# Patient Record
Sex: Male | Born: 1970 | Race: Black or African American | Hispanic: No | Marital: Married | Smoking: Never smoker
Health system: Southern US, Community
[De-identification: ages and names within clinical notes are randomized; demographics above are authoritative.]

## PROBLEM LIST (undated history)

## (undated) DIAGNOSIS — F419 Anxiety disorder, unspecified: Secondary | ICD-10-CM

## (undated) DIAGNOSIS — F4310 Post-traumatic stress disorder, unspecified: Secondary | ICD-10-CM

## (undated) DIAGNOSIS — F32A Depression, unspecified: Secondary | ICD-10-CM

## (undated) DIAGNOSIS — F329 Major depressive disorder, single episode, unspecified: Secondary | ICD-10-CM

## (undated) HISTORY — DX: Depression, unspecified: F32.A

## (undated) HISTORY — DX: Anxiety disorder, unspecified: F41.9

## (undated) HISTORY — DX: Major depressive disorder, single episode, unspecified: F32.9

## (undated) HISTORY — DX: Post-traumatic stress disorder, unspecified: F43.10

---

## 2007-09-05 HISTORY — PX: REFRACTIVE SURGERY: SHX103

## 2015-04-17 ENCOUNTER — Ambulatory Visit (INDEPENDENT_AMBULATORY_CARE_PROVIDER_SITE_OTHER): Payer: TRICARE For Life (TFL)

## 2015-04-17 ENCOUNTER — Ambulatory Visit (INDEPENDENT_AMBULATORY_CARE_PROVIDER_SITE_OTHER): Payer: TRICARE For Life (TFL) | Admitting: Family Medicine

## 2015-04-17 VITALS — BP 136/80 | HR 73 | Temp 98.1°F | Ht 77.75 in | Wt 239.0 lb

## 2015-04-17 DIAGNOSIS — M545 Low back pain, unspecified: Secondary | ICD-10-CM

## 2015-04-17 DIAGNOSIS — M79604 Pain in right leg: Secondary | ICD-10-CM

## 2015-04-17 MED ORDER — METHOCARBAMOL 500 MG PO TABS: 500.0000 mg | ORAL_TABLET | Freq: Every evening | ORAL | Status: AC | PRN

## 2015-04-17 MED ORDER — METHYLPREDNISOLONE ACETATE 80 MG/ML IJ SUSP
80.0000 mg | Freq: Once | INTRAMUSCULAR | Status: AC
  Administered 2015-04-17: 80 mg via INTRAMUSCULAR

## 2015-04-17 MED ORDER — CELECOXIB 200 MG PO CAPS: 200.0000 mg | ORAL_CAPSULE | Freq: Two times a day (BID) | ORAL | Status: AC

## 2015-04-17 NOTE — Progress Notes (Signed)
Subjective:    Patient ID: Brandon Paul, male    DOB: 1971/03/09, 44 y.o.   MRN: [id number]  04/17/2015  Tailbone Pain   HPI This 44 y.o. male presents for evaluation of tailbone pain.  Retired [employer]or; diagnosed with sacroiliitis; s/p injections.  Last injection May , 2016 at [facility] in [place].  Just moved from [place] last month.  Was in active duty. Unable to lie on R side at night.  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  Likely back pain is discogenic.  S/p epidurla steroid injection at time; did provide relief with last injection.  Dr. Knox Royalty is out of town; he referred pt to [facility] this weekend; this is brother.  Ongoing for six years.  In last twelve months, pain has worsened.  In past six months, acutely worse.  Pain that experiencing now is the worst ever had.  Lower back pain radiating into R thigh. +n/t/burning in R thigh for past month; constant tingling.  Takes Naprosen  every morning.  No weakness.  Sitting makes pain worse.  +saddle paresthesias; started 3 weeks ago; intermittent.  Tingling in suprapubic region.  Normal b/b dysfunction.   Plantar fasciitis, ED, chronic prostatitis, PTSD, disc degeneration L5-S1.  Sacroiliitis.   Previous use of Celebrex.  Tramadol in past but really strong; could not tolerate well.     Review of Systems  Constitutional: Negative for fever, chills, diaphoresis, activity change, appetite change and fatigue.  Respiratory: Negative for cough and shortness of breath.   Cardiovascular: Negative for chest pain, palpitations and leg swelling.  Gastrointestinal: Negative for nausea, vomiting, abdominal pain and diarrhea.  Endocrine: Negative for cold intolerance, heat intolerance, polydipsia, polyphagia and polyuria.  Genitourinary: Negative for decreased urine volume and difficulty urinating.  Musculoskeletal: Positive for myalgias and back pain.  Skin: Negative for color change, rash and wound.    Neurological: Positive for numbness. Negative for dizziness, tremors, seizures, syncope, facial asymmetry, speech difficulty, weakness, light-headedness and headaches.  Psychiatric/Behavioral: Negative for sleep disturbance and dysphoric mood. The patient is not nervous/anxious.     Past Medical History  Diagnosis Date  . Depression   . Anxiety   . PTSD (post-traumatic stress disorder)    Past Surgical History  Procedure Laterality Date  . Refractive surgery  2009   No Known Allergies Social History   Social History  . Marital Status: Married    Spouse Name: N/A  . Number of Children: N/A  . Years of Education: N/A   Occupational History  . Not on file.   Social History Main Topics  . Smoking status: Never Smoker   . Smokeless tobacco: Never Used  . Alcohol Use: 0.0 oz/week    0 Standard drinks or equivalent per week  . Drug Use: Not on file  . Sexual Activity: Not on file   Other Topics Concern  . Not on file   Social History Narrative  . No narrative on file   History reviewed. No pertinent family history.      Objective:    BP 136/80 mmHg  Pulse 73  Temp(Src) 98.1 F (36.7 C) (Oral)  Ht 6' 5.75" (1.975 m)  Wt 239 lb (108.41 kg)  BMI 27.79 kg/m2  SpO2 97% Physical Exam  Constitutional: He is oriented to person, place, and time. He appears well-developed and well-nourished. No distress.  HENT:  Head: Normocephalic and atraumatic.  Eyes: Conjunctivae and EOM are normal. Pupils are equal, round, and reactive to  light.  Neck: Normal range of motion. Neck supple. Carotid bruit is not present. No thyromegaly present.  Cardiovascular: Normal rate, regular rhythm, normal heart sounds and intact distal pulses.  Exam reveals no gallop and no friction rub.   No murmur heard. Pulmonary/Chest: Effort normal and breath sounds normal. He has no wheezes. He has no rales.  Musculoskeletal:       Lumbar back: He exhibits tenderness, bony tenderness and pain. He  exhibits normal range of motion, no swelling, no edema, no spasm and normal pulse.  Lumbar spine:  Non-tender midline; +tender paraspinal regions R near SI joint.  Straight leg raises negative B; toe and heel walking intact; marching intact; motor 5/5 BLE.  Full ROM lumbar spine without limitation.   Lymphadenopathy:    He has no cervical adenopathy.  Neurological: He is alert and oriented to person, place, and time. No cranial nerve deficit.  Skin: Skin is warm and dry. No rash noted. He is not diaphoretic.  Psychiatric: He has a normal mood and affect. His behavior is normal.  Nursing note and vitals reviewed.  No results found for this or any previous visit.     [facility] reading (PRIMARY) by  Dr. Katrinka Blazing.  LUMBAR SPINE FILMS: NAD.   DEPOMEDROL 80MG  IM ADMINISTERED BY DARRON, CMA. Assessment & Plan:   1. Lumbar pain with radiation down right leg    -Chronic with recent worsening. -New onset radicular symptoms. -s/p Depomedrol in office. -Rx for Celebrex provided; rx for Robaxin provided to take at night. -Stop Naproxen. -Home exercise program provided. -Refer to ortho.    Meds ordered this encounter  Medications  . naproxen (NAPROSYN) 500 MG tablet    Sig: Take 500 mg by mouth 2 (two) times daily with a meal.  . Zolpidem Tartrate (AMBIEN PO)    Sig: Take by mouth.  . Sildenafil Citrate (VIAGRA PO)    Sig: Take by mouth.  . Cetirizine HCl (ZYRTEC ALLERGY PO)    Sig: Take by mouth.  . methylPREDNISolone acetate (DEPO-MEDROL) injection 80 mg    Sig:   . methocarbamol (ROBAXIN) 500 MG tablet    Sig: Take 1-2 tablets (500-1,000 mg total) by mouth at bedtime as needed for muscle spasms.    Dispense:  60 tablet    Refill:  2    No Follow-up on file.   Willett Lefeber Paulita Fujita, M.D. [facility] [address] [place], [place]  [address] [phone] phone [phone] fax

## 2015-04-17 NOTE — Patient Instructions (Signed)
Sciatica with Rehab The sciatic nerve runs from the back down the leg and is responsible for sensation and control of the muscles in the back (posterior) side of the thigh, lower leg, and foot. Sciatica is a condition that is characterized by inflammation of this nerve.  SYMPTOMS   Signs of nerve damage, including numbness and/or weakness along the posterior side of the lower extremity.  Pain in the back of the thigh that may also travel down the leg.  Pain that worsens when sitting for long periods of time.  Occasionally, pain in the back or buttock. CAUSES  Inflammation of the sciatic nerve is the cause of sciatica. The inflammation is due to something irritating the nerve. Common sources of irritation include:  Sitting for long periods of time.  Direct trauma to the nerve.  Arthritis of the spine.  Herniated or ruptured disk.  Slipping of the vertebrae (spondylolisthesis).  Pressure from soft tissues, such as muscles or ligament-like tissue (fascia). RISK INCREASES WITH:  Sports that place pressure or stress on the spine (football or weightlifting).  Poor strength and flexibility.  Failure to warm up properly before activity.  Family history of low back pain or disk disorders.  Previous back injury or surgery.  Poor body mechanics, especially when lifting, or poor posture. PREVENTION   Warm up and stretch properly before activity.  Maintain physical fitness:  Strength, flexibility, and endurance.  Cardiovascular fitness.  Learn and use proper technique, especially with posture and lifting. When possible, have coach correct improper technique.  Avoid activities that place stress on the spine. PROGNOSIS If treated properly, then sciatica usually resolves within 6 weeks. However, occasionally surgery is necessary.  RELATED COMPLICATIONS   Permanent nerve damage, including pain, numbness, tingle, or weakness.  Chronic back pain.  Risks of surgery: infection,  bleeding, nerve damage, or damage to surrounding tissues. TREATMENT Treatment initially involves resting from any activities that aggravate your symptoms. The use of ice and medication may help reduce pain and inflammation. The use of strengthening and stretching exercises may help reduce pain with activity. These exercises may be performed at home or with referral to a therapist. A therapist may recommend further treatments, such as transcutaneous electronic nerve stimulation (TENS) or ultrasound. Your caregiver may recommend corticosteroid injections to help reduce inflammation of the sciatic nerve. If symptoms persist despite non-surgical (conservative) treatment, then surgery may be recommended. MEDICATION  If pain medication is necessary, then nonsteroidal anti-inflammatory medications, such as aspirin and ibuprofen, or other minor pain relievers, such as acetaminophen, are often recommended.  Do not take pain medication for 7 days before surgery.  Prescription pain relievers may be given if deemed necessary by your caregiver. Use only as directed and only as much as you need.  Ointments applied to the skin may be helpful.  Corticosteroid injections may be given by your caregiver. These injections should be reserved for the most serious cases, because they may only be given a certain number of times. HEAT AND COLD  Cold treatment (icing) relieves pain and reduces inflammation. Cold treatment should be applied for 10 to 15 minutes every 2 to 3 hours for inflammation and pain and immediately after any activity that aggravates your symptoms. Use ice packs or massage the area with a piece of ice (ice massage).  Heat treatment may be used prior to performing the stretching and strengthening activities prescribed by your caregiver, physical therapist, or athletic trainer. Use a heat pack or soak the injury in warm water.   SEEK MEDICAL CARE IF:  Treatment seems to offer no benefit, or the condition  worsens.  Any medications produce adverse side effects. EXERCISES  RANGE OF MOTION (ROM) AND STRETCHING EXERCISES - Sciatica Most people with sciatic will find that their symptoms worsen with either excessive bending forward (flexion) or arching at the low back (extension). The exercises which will help resolve your symptoms will focus on the opposite motion. Your physician, physical therapist or athletic trainer will help you determine which exercises will be most helpful to resolve your low back pain. Do not complete any exercises without first consulting with your clinician. Discontinue any exercises which worsen your symptoms until you speak to your clinician. If you have pain, numbness or tingling which travels down into your buttocks, leg or foot, the goal of the therapy is for these symptoms to move closer to your back and eventually resolve. Occasionally, these leg symptoms will get better, but your low back pain may worsen; this is typically an indication of progress in your rehabilitation. Be certain to be very alert to any changes in your symptoms and the activities in which you participated in the 24 hours prior to the change. Sharing this information with your clinician will allow him/her to most efficiently treat your condition. These exercises may help you when beginning to rehabilitate your injury. Your symptoms may resolve with or without further involvement from your physician, physical therapist or athletic trainer. While completing these exercises, remember:   Restoring tissue flexibility helps normal motion to return to the joints. This allows healthier, less painful movement and activity.  An effective stretch should be held for at least 30 seconds.  A stretch should never be painful. You should only feel a gentle lengthening or release in the stretched tissue. FLEXION RANGE OF MOTION AND STRETCHING EXERCISES: STRETCH - Flexion, Single Knee to Chest   Lie on a firm bed or floor  with both legs extended in front of you.  Keeping one leg in contact with the floor, bring your opposite knee to your chest. Hold your leg in place by either grabbing behind your thigh or at your knee.  Pull until you feel a gentle stretch in your low back. Hold __________ seconds.  Slowly release your grasp and repeat the exercise with the opposite side. Repeat __________ times. Complete this exercise __________ times per day.  STRETCH - Flexion, Double Knee to Chest  Lie on a firm bed or floor with both legs extended in front of you.  Keeping one leg in contact with the floor, bring your opposite knee to your chest.  Tense your stomach muscles to support your back and then lift your other knee to your chest. Hold your legs in place by either grabbing behind your thighs or at your knees.  Pull both knees toward your chest until you feel a gentle stretch in your low back. Hold __________ seconds.  Tense your stomach muscles and slowly return one leg at a time to the floor. Repeat __________ times. Complete this exercise __________ times per day.  STRETCH - Low Trunk Rotation   Lie on a firm bed or floor. Keeping your legs in front of you, bend your knees so they are both pointed toward the ceiling and your feet are flat on the floor.  Extend your arms out to the side. This will stabilize your upper body by keeping your shoulders in contact with the floor.  Gently and slowly drop both knees together to one side until   you feel a gentle stretch in your low back. Hold for __________ seconds.  Tense your stomach muscles to support your low back as you bring your knees back to the starting position. Repeat the exercise to the other side. Repeat __________ times. Complete this exercise __________ times per day  EXTENSION RANGE OF MOTION AND FLEXIBILITY EXERCISES: STRETCH - Extension, Prone on Elbows  Lie on your stomach on the floor, a bed will be too soft. Place your palms about shoulder  width apart and at the height of your head.  Place your elbows under your shoulders. If this is too painful, stack pillows under your chest.  Allow your body to relax so that your hips drop lower and make contact more completely with the floor.  Hold this position for __________ seconds.  Slowly return to lying flat on the floor. Repeat __________ times. Complete this exercise __________ times per day.  RANGE OF MOTION - Extension, Prone Press Ups  Lie on your stomach on the floor, a bed will be too soft. Place your palms about shoulder width apart and at the height of your head.  Keeping your back as relaxed as possible, slowly straighten your elbows while keeping your hips on the floor. You may adjust the placement of your hands to maximize your comfort. As you gain motion, your hands will come more underneath your shoulders.  Hold this position __________ seconds.  Slowly return to lying flat on the floor. Repeat __________ times. Complete this exercise __________ times per day.  STRENGTHENING EXERCISES - Sciatica  These exercises may help you when beginning to rehabilitate your injury. These exercises should be done near your "sweet spot." This is the neutral, low-back arch, somewhere between fully rounded and fully arched, that is your least painful position. When performed in this safe range of motion, these exercises can be used for people who have either a flexion or extension based injury. These exercises may resolve your symptoms with or without further involvement from your physician, physical therapist or athletic trainer. While completing these exercises, remember:   Muscles can gain both the endurance and the strength needed for everyday activities through controlled exercises.  Complete these exercises as instructed by your physician, physical therapist or athletic trainer. Progress with the resistance and repetition exercises only as your caregiver advises.  You may  experience muscle soreness or fatigue, but the pain or discomfort you are trying to eliminate should never worsen during these exercises. If this pain does worsen, stop and make certain you are following the directions exactly. If the pain is still present after adjustments, discontinue the exercise until you can discuss the trouble with your clinician. STRENGTHENING - Deep Abdominals, Pelvic Tilt   Lie on a firm bed or floor. Keeping your legs in front of you, bend your knees so they are both pointed toward the ceiling and your feet are flat on the floor.  Tense your lower abdominal muscles to press your low back into the floor. This motion will rotate your pelvis so that your tail bone is scooping upwards rather than pointing at your feet or into the floor.  With a gentle tension and even breathing, hold this position for __________ seconds. Repeat __________ times. Complete this exercise __________ times per day.  STRENGTHENING - Abdominals, Crunches   Lie on a firm bed or floor. Keeping your legs in front of you, bend your knees so they are both pointed toward the ceiling and your feet are flat on the   floor. Cross your arms over your chest.  Slightly tip your chin down without bending your neck.  Tense your abdominals and slowly lift your trunk high enough to just clear your shoulder blades. Lifting higher can put excessive stress on the low back and does not further strengthen your abdominal muscles.  Control your return to the starting position. Repeat __________ times. Complete this exercise __________ times per day.  STRENGTHENING - Quadruped, Opposite UE/LE Lift  Assume a hands and knees position on a firm surface. Keep your hands under your shoulders and your knees under your hips. You may place padding under your knees for comfort.  Find your neutral spine and gently tense your abdominal muscles so that you can maintain this position. Your shoulders and hips should form a rectangle  that is parallel with the floor and is not twisted.  Keeping your trunk steady, lift your right hand no higher than your shoulder and then your left leg no higher than your hip. Make sure you are not holding your breath. Hold this position __________ seconds.  Continuing to keep your abdominal muscles tense and your back steady, slowly return to your starting position. Repeat with the opposite arm and leg. Repeat __________ times. Complete this exercise __________ times per day.  STRENGTHENING - Abdominals and Quadriceps, Straight Leg Raise   Lie on a firm bed or floor with both legs extended in front of you.  Keeping one leg in contact with the floor, bend the other knee so that your foot can rest flat on the floor.  Find your neutral spine, and tense your abdominal muscles to maintain your spinal position throughout the exercise.  Slowly lift your straight leg off the floor about 6 inches for a count of 15, making sure to not hold your breath.  Still keeping your neutral spine, slowly lower your leg all the way to the floor. Repeat this exercise with each leg __________ times. Complete this exercise __________ times per day. POSTURE AND BODY MECHANICS CONSIDERATIONS - Sciatica Keeping correct posture when sitting, standing or completing your activities will reduce the stress put on different body tissues, allowing injured tissues a chance to heal and limiting painful experiences. The following are general guidelines for improved posture. Your physician or physical therapist will provide you with any instructions specific to your needs. While reading these guidelines, remember:  The exercises prescribed by your provider will help you have the flexibility and strength to maintain correct postures.  The correct posture provides the optimal environment for your joints to work. All of your joints have less wear and tear when properly supported by a spine with good posture. This means you will  experience a healthier, less painful body.  Correct posture must be practiced with all of your activities, especially prolonged sitting and standing. Correct posture is as important when doing repetitive low-stress activities (typing) as it is when doing a single heavy-load activity (lifting). RESTING POSITIONS Consider which positions are most painful for you when choosing a resting position. If you have pain with flexion-based activities (sitting, bending, stooping, squatting), choose a position that allows you to rest in a less flexed posture. You would want to avoid curling into a fetal position on your side. If your pain worsens with extension-based activities (prolonged standing, working overhead), avoid resting in an extended position such as sleeping on your stomach. Most people will find more comfort when they rest with their spine in a more neutral position, neither too rounded nor too   arched. Lying on a non-sagging bed on your side with a pillow between your knees, or on your back with a pillow under your knees will often provide some relief. Keep in mind, being in any one position for a prolonged period of time, no matter how correct your posture, can still lead to stiffness. PROPER SITTING POSTURE In order to minimize stress and discomfort on your spine, you must sit with correct posture Sitting with good posture should be effortless for a healthy body. Returning to good posture is a gradual process. Many people can work toward this most comfortably by using various supports until they have the flexibility and strength to maintain this posture on their own. When sitting with proper posture, your ears will fall over your shoulders and your shoulders will fall over your hips. You should use the back of the chair to support your upper back. Your low back will be in a neutral position, just slightly arched. You may place a small pillow or folded towel at the base of your low back for support.  When  working at a desk, create an environment that supports good, upright posture. Without extra support, muscles fatigue and lead to excessive strain on joints and other tissues. Keep these recommendations in mind: CHAIR:   A chair should be able to slide under your desk when your back makes contact with the back of the chair. This allows you to work closely.  The chair's height should allow your eyes to be level with the upper part of your monitor and your hands to be slightly lower than your elbows. BODY POSITION  Your feet should make contact with the floor. If this is not possible, use a foot rest.  Keep your ears over your shoulders. This will reduce stress on your neck and low back. INCORRECT SITTING POSTURES   If you are feeling tired and unable to assume a healthy sitting posture, do not slouch or slump. This puts excessive strain on your back tissues, causing more damage and pain. Healthier options include:  Using more support, like a lumbar pillow.  Switching tasks to something that requires you to be upright or walking.  Talking a brief walk.  Lying down to rest in a neutral-spine position. PROLONGED STANDING WHILE SLIGHTLY LEANING FORWARD  When completing a task that requires you to lean forward while standing in one place for a long time, place either foot up on a stationary 2-4 inch high object to help maintain the best posture. When both feet are on the ground, the low back tends to lose its slight inward curve. If this curve flattens (or becomes too large), then the back and your other joints will experience too much stress, fatigue more quickly and can cause pain.  CORRECT STANDING POSTURES Proper standing posture should be assumed with all daily activities, even if they only take a few moments, like when brushing your teeth. As in sitting, your ears should fall over your shoulders and your shoulders should fall over your hips. You should keep a slight tension in your abdominal  muscles to brace your spine. Your tailbone should point down to the ground, not behind your body, resulting in an over-extended swayback posture.  INCORRECT STANDING POSTURES  Common incorrect standing postures include a forward head, locked knees and/or an excessive swayback. WALKING Walk with an upright posture. Your ears, shoulders and hips should all line-up. PROLONGED ACTIVITY IN A FLEXED POSITION When completing a task that requires you to bend forward   at your waist or lean over a low surface, try to find a way to stabilize 3 of 4 of your limbs. You can place a hand or elbow on your thigh or rest a knee on the surface you are reaching across. This will provide you more stability so that your muscles do not fatigue as quickly. By keeping your knees relaxed, or slightly bent, you will also reduce stress across your low back. CORRECT LIFTING TECHNIQUES DO :   Assume a wide stance. This will provide you more stability and the opportunity to get as close as possible to the object which you are lifting.  Tense your abdominals to brace your spine; then bend at the knees and hips. Keeping your back locked in a neutral-spine position, lift using your leg muscles. Lift with your legs, keeping your back straight.  Test the weight of unknown objects before attempting to lift them.  Try to keep your elbows locked down at your sides in order get the best strength from your shoulders when carrying an object.  Always ask for help when lifting heavy or awkward objects. INCORRECT LIFTING TECHNIQUES DO NOT:   Lock your knees when lifting, even if it is a small object.  Bend and twist. Pivot at your feet or move your feet when needing to change directions.  Assume that you cannot safely pick up a paperclip without proper posture. Document Released: 08/21/2005 Document Revised: 01/05/2014 Document Reviewed: 12/03/2008 [employer] Patient Information 2015 [employer]. This information is not intended to  replace advice given to you by your health care provider. Make sure you discuss any questions you have with your health care provider.  

## 2016-01-05 IMAGING — CR DG LUMBAR SPINE COMPLETE 4+V
5 series · 5 of 5 positions shown · non-contrast
Comparison: None.

CLINICAL DATA: Low back pain radiating down the right leg.

EXAM:
LUMBAR SPINE - COMPLETE 4+ VIEW

[AP]
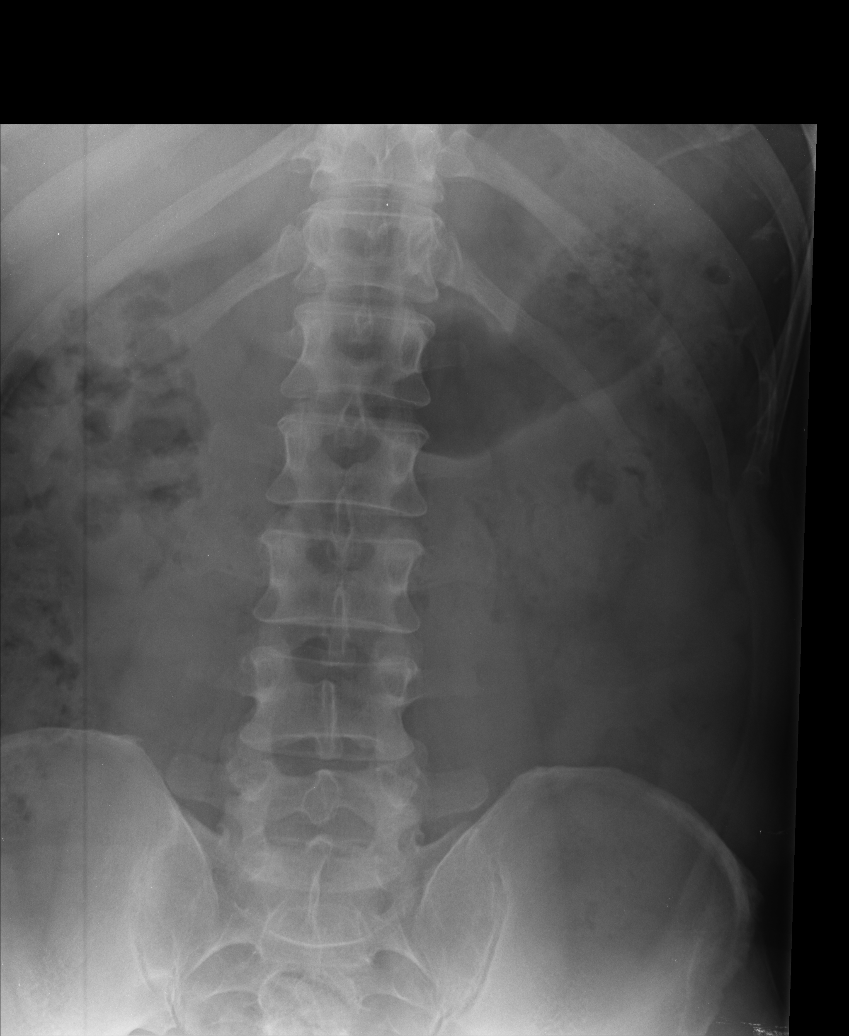

[rpo]
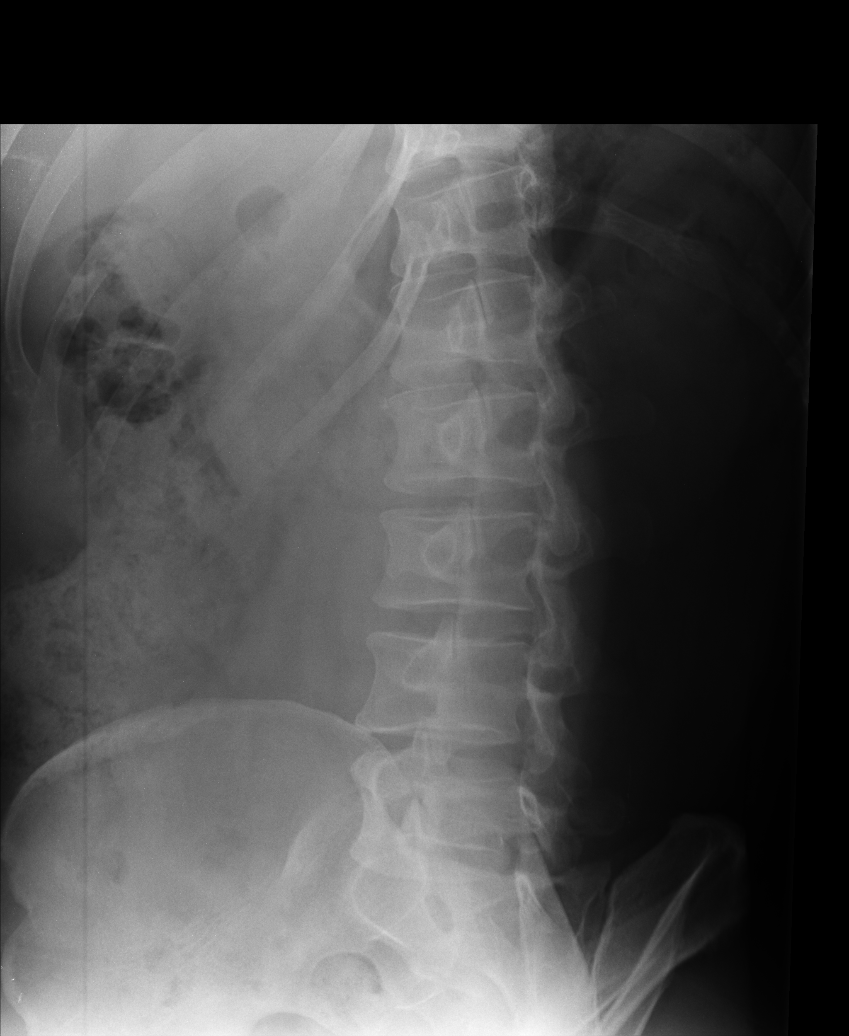

[lpo]
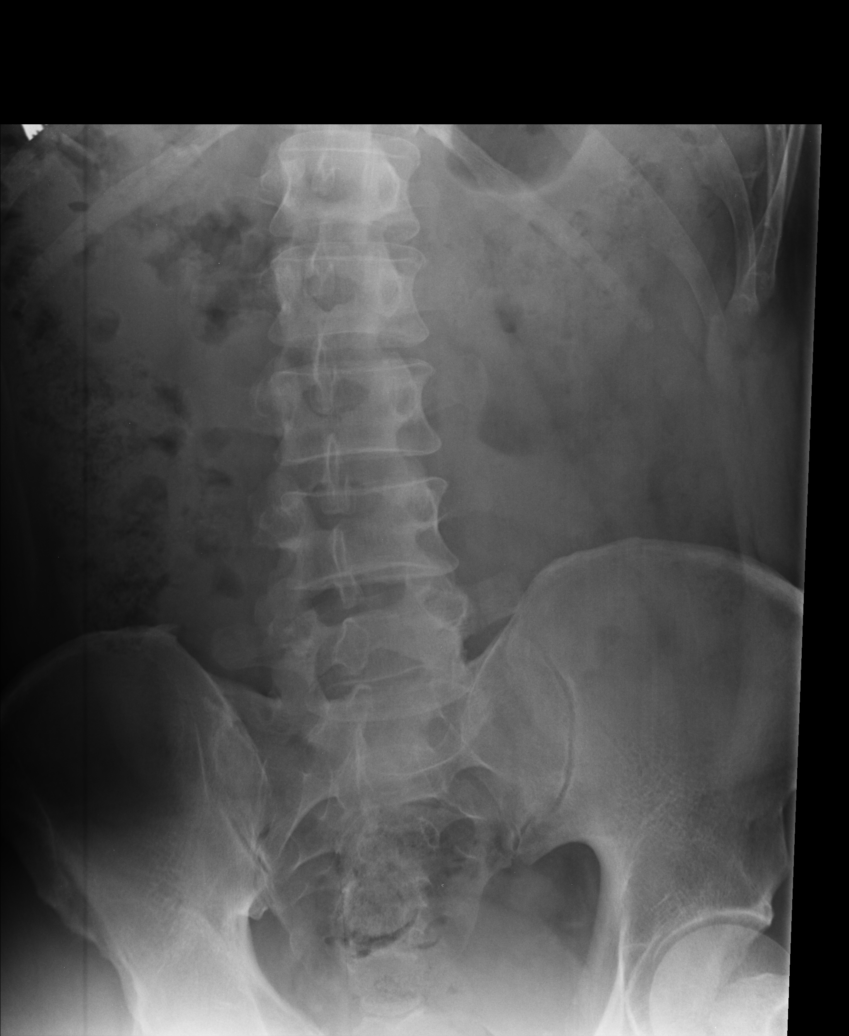

[lateral]
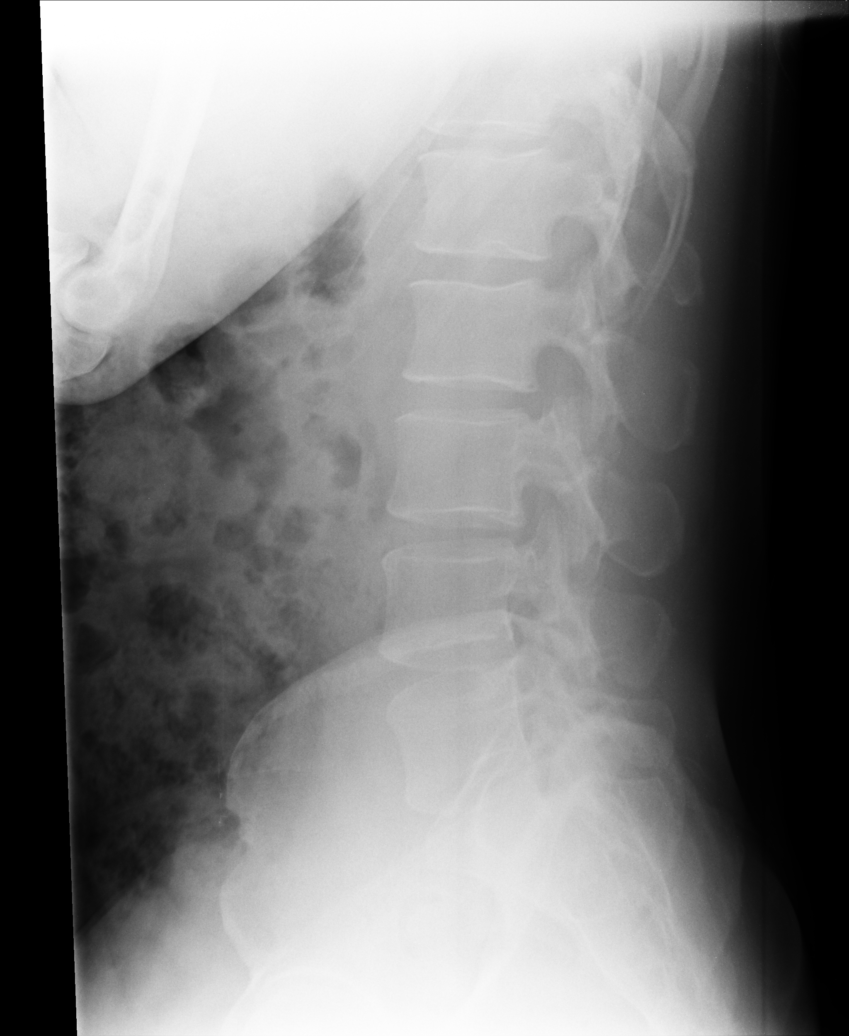

[l5 s1]
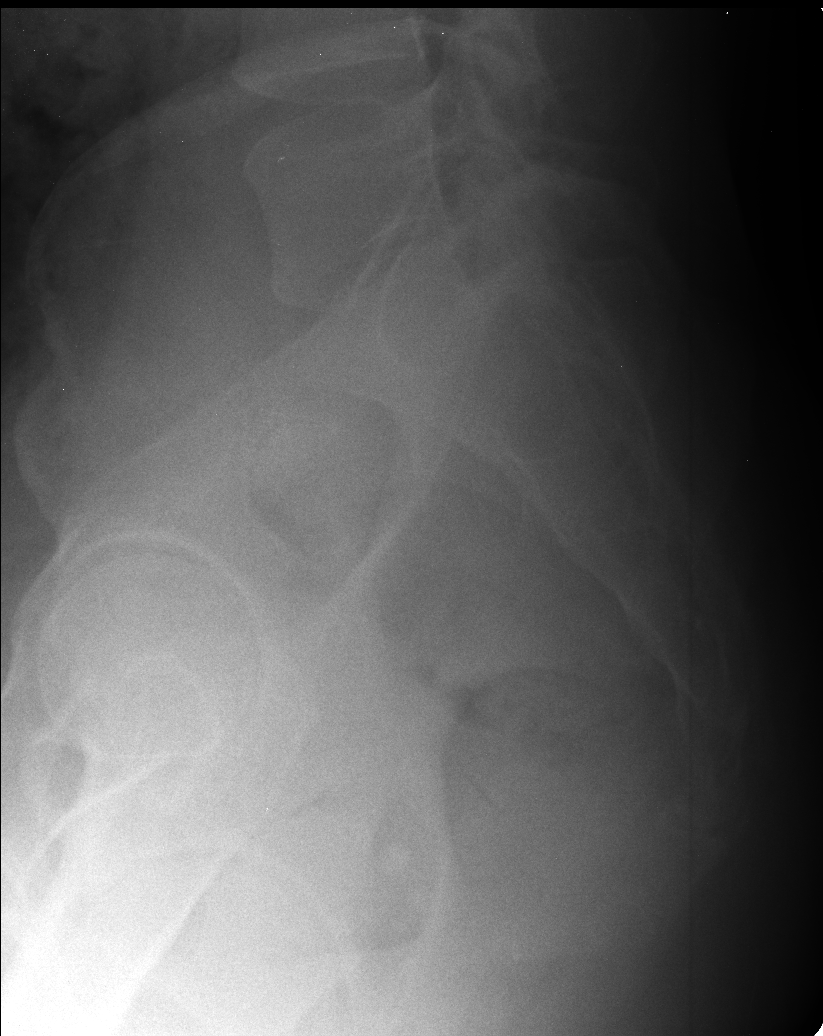

[5 of 5 positions shown; findings below may reference images not displayed]

FINDINGS: There is no evidence of lumbar spine fracture. Alignment is normal.
Intervertebral disc spaces are maintained.
IMPRESSION: Negative.
